# Patient Record
Sex: Male | Born: 1995 | Race: White | Hispanic: No | Marital: Single | Smoking: Current every day smoker
Health system: Southern US, Community
[De-identification: ages and names within clinical notes are randomized; demographics above are authoritative.]

## PROBLEM LIST (undated history)

## (undated) DIAGNOSIS — M24419 Recurrent dislocation, unspecified shoulder: Secondary | ICD-10-CM

## (undated) HISTORY — PX: ANTERIOR CRUCIATE LIGAMENT REPAIR: SHX115

---
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
## 2015-07-28 ENCOUNTER — Emergency Department (HOSPITAL_COMMUNITY): Payer: Worker's Compensation

## 2015-07-28 ENCOUNTER — Emergency Department (HOSPITAL_COMMUNITY)
Admission: EM | Admit: 2015-07-28 | Discharge: 2015-07-28 | Disposition: A | Discharge status: Home / Self Care | Payer: Worker's Compensation | Attending: Emergency Medicine | Admitting: Emergency Medicine

## 2015-07-28 ENCOUNTER — Encounter (HOSPITAL_COMMUNITY): Payer: Self-pay | Admitting: Emergency Medicine

## 2015-07-28 DIAGNOSIS — M25512 Pain in left shoulder: Secondary | ICD-10-CM

## 2015-07-28 DIAGNOSIS — Z7982 Long term (current) use of aspirin: Secondary | ICD-10-CM | POA: Insufficient documentation

## 2015-07-28 DIAGNOSIS — F17210 Nicotine dependence, cigarettes, uncomplicated: Secondary | ICD-10-CM | POA: Insufficient documentation

## 2015-07-28 HISTORY — DX: Recurrent dislocation, unspecified shoulder: M24.419

## 2015-07-28 MED ORDER — KETOROLAC TROMETHAMINE 60 MG/2ML IM SOLN
60.0000 mg | Freq: Once | INTRAMUSCULAR | Status: AC
  Administered 2015-07-28: 60 mg via INTRAMUSCULAR
  Filled 2015-07-28: qty 2

## 2015-07-28 NOTE — ED Notes (Signed)
Pt from work with c/o shoulder dislocation while shutting a door.  Hx of dislocation.  Shoulder already reset.  NAD, A&O.

## 2015-07-28 NOTE — Discharge Instructions (Signed)
You have been seen today for shoulder pain. Your imaging showed no abnormalities. It is possible you dislocated your shoulder, as reported, and that it was put back in place sufficiently, as reported. You will need to follow up with Dr. Thomasena Edis as soon as possible on this matter. Shoulder dislocations will continue to happen, possibly even at inopportune moments, until the problem is surgically repaired. Take extra care to avoid the movements that you know will dislocate your shoulder. Use ibuprofen or naproxen for pain and inflammation. Apply ice as needed to reduce inflammation. Follow up with PCP as needed. Return to ED should symptoms worsen.  RESOURCE GUIDE  Chronic Pain Problems: Contact [facility]  [phone] Patients need to be referred by their primary care doctor.  Insufficient Money for Medicine: Contact United Way:  call "211" or [facility] [phone].  No Primary Care Doctor: - Call [facility] Connect  [phone] - can help you locate a primary care doctor that  accepts your insurance, provides certain services, etc. - Physician Referral Service- [phone]  Agencies that provide inexpensive medical care: - [facility]  [phone] - [facility]  [phone] - [facility]  [phone] - Women's Clinic  [phone] - [facility]  [phone] [facility]  [phone]  Medicaid-accepting [facility] Providers: - [facility]- [address]  [phone], Mon-Fri 9am-7pm, Sat 9am-1pm - [facility]- [address] [place], Suite [place]  [phone] - [facility]- [address], [address] [place]  [phone] [facility]- [address]  [phone] - Renaye Rakers- [address] [place], [address], [phone]  Only accepts [place] patients after they have their name  applied to their card  Self Pay (no  insurance) in [place]: - Sickle Cell Patients: Dr Willey Blade, [facility]  [address] [place], [phone] - [facility]- [address] [place]  [phone]       [facility] [place]- [address]       -     [facility]- see information above (Speak to [employer] if you do not have insurance)       -  [facility]- [address] [place], [phone]       -  [facility]- [address],  [phone]       -  [facility]- [address] [phone]       -  Dr Julio Sicks-  [address], [place], [phone]       -  [facility]- [address], [phone]       -  [facility]- [address], [phone], also [address] [phone]       -    [facility]- [address] [place], [phone], 1st & 3rd Saturday   every month, 10am-1pm  1) Find a Doctor and Pay Out of Pocket Although you won't have to find out who is covered by your insurance plan, it is a good idea to ask around and get recommendations. You will then need to call the office and see if the doctor you have chosen will accept you as a new patient and what types of options they offer for patients  who are self-pay. Some doctors offer discounts or will set up payment plans for their patients who do not have insurance, but you will need to ask so you aren't surprised when you get to your appointment.  2) Contact Your Local Health Department Not all health departments have doctors that can see patients for sick visits, but many do, so it is worth a call to see if yours does. If you don't know where your local health department is, you can check in your phone book. The CDC also has a tool to help you locate your state's health department, and many state websites also have listings of all of their local health departments.  3) Find a Walk-in Clinic If your illness is not likely to be very severe  or complicated, you may want to try a walk in clinic. These are popping up all over the country in pharmacies, drugstores, and shopping centers. They're usually staffed by nurse practitioners or physician assistants that have been trained to treat common illnesses and complaints. They're usually fairly quick and inexpensive. However, if you have serious medical issues or chronic medical problems, these are probably not your best option  STD Testing - [facility] Department of [facility], STD Clinic, [address] [place], phone [phone] or [phone].  Monday - Friday, call for an appointment. [facility] Department of [employer], STD Clinic, [address], [place], phone [phone] or [phone].  Monday - Friday, call for an appointment.  Abuse/Neglect: [facility] Child Abuse Hotline (301)814-8542 [facility] Child Abuse Hotline 332-088-4107 (After Hours)  Emergency Shelter:  [facility] 250-338-1368  Maternity Homes: - Room at the [place] [facility] 9794094320 - [facility] [phone]  MRSA Hotline #:   (206)772-6716  [facility] Resources  [facility] [place]  United Way [facility] Dept. [address]                 [address]         [address] [place] [address]  Blondell Reveal Phone:  [phone]                                  Phone:  [phone]                   Phone:  [phone]  [facility], [phone] - [facility] - [phone]       -     [facility] in [place], [address],                                  [phone], Insurance  [place] Child Abuse Hotline 978-700-7810 or (623) 145-8753 (After Hours)   Behavioral Health Services  Substance  Abuse Resources: -  [facility] Drug Services  ([phone] - [facility] [phone] - [facility] [phone] Floyd[facility] Flock[facility] [phone] - Residential & Outpatient Substance Abuse Program  [phone]  Psychological Services: Tressie Ellis- [facility]  [phone] - [facility]  [phone] - [facility], [phone] [place] [address], [place], ACCESS LINE: [phone] or ([phone], [email or web address]  Dental Assistance  If unable to pay or uninsured, contact:  [facility] or [facility] to become qualified for the adult dental clinic.  Patients with Medicaid: [facility] [phone], [phone], [phone]  If unable to pay, or uninsured, contact [facility] [phone]) or [facility] [phone] in [place], [phone] in [facility]) to become qualified for the adult dental clinic   Other Low-Cost Community Dental Services: - [facility]- [address], [place], [place], [address], [phone], Ext. [address], 2nd and 4th Thursday of the month at 6:30am.  10 clients each day by appointment, can sometimes see walk-in patients if someone does not show for an appointment. [facility]- [address], [place], [place], [address], [phone] - [facility]- [address], [place], [place], [address], [phone] - [facility]- [phone] [facility]- [facility]- [phone] [facility]- [phone]

## 2015-07-28 NOTE — ED Provider Notes (Signed)
CSN: [id number]     Arrival date & time 07/28/15  1413 History   First MD Initiated Contact with Patient 07/28/15 1415     Chief Complaint  Patient presents with  . Shoulder Injury     (Consider location/radiation/quality/duration/timing/severity/associated sxs/prior Treatment) HPI   Cameron Gonzales is a 20 y.o. male, with a history of left shoulder dislocation, presenting to the ED with left shoulder pain that occurred just PTA. Rates pain 0.5/10, aching, nonradiating. 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 This has happened at least 4 times before and is due to a labral tear at the patient has been told he needs surgery to correct. Patient has previously seen Dr. [facility] with [facility]. Patient states that he corrected the dislocation himself. Patient denies neuro or functional deficits, falls/trauma, or any other complaints or injuries.    Past Medical History  Diagnosis Date  . Shoulder dislocation, recurrent     left   Past Surgical History  Procedure Laterality Date  . Anterior cruciate ligament repair     History reviewed. No pertinent family history. Social History  Substance Use Topics  . Smoking status: Current Every Day Smoker -- 0.50 packs/day    Types: Cigarettes  . Smokeless tobacco: Never Used  . Alcohol Use: No    Review of Systems  Musculoskeletal: Positive for arthralgias (left shoulder). Negative for back pain.  Neurological: Negative for weakness and numbness.      Allergies  Review of patient's allergies indicates no known allergies.  Home Medications   Prior to Admission medications   Medication Sig Start Date End Date Taking? Authorizing Provider  aspirin 81 MG chewable tablet Chew 81 mg by mouth daily.    Historical Provider, MD  ibuprofen (ADVIL,MOTRIN) 200 MG tablet Take 600 mg by mouth every 6 (six) hours as needed. For pain & fever    Historical Provider, MD   BP 150/87  mmHg  Pulse 74  Temp(Src) 98.5 F (36.9 C) (Oral)  Resp 18  SpO2 100% Physical Exam  Constitutional: He appears well-developed and well-nourished. No distress.  HENT:  Head: Normocephalic and atraumatic.  Eyes: Conjunctivae are normal.  Cardiovascular: Normal rate and regular rhythm.   Pulmonary/Chest: Effort normal.  Musculoskeletal:  Full passive and active range of motion in the left shoulder and left elbow with minimal pain. No indication on exam to suggest recurrent dislocation. Circulation intact distally.  Neurological: He is alert.  Sensory deficits. Strength 5 out of 5.  Skin: Skin is warm and dry. He is not diaphoretic.  Nursing note and vitals reviewed.   ED Course  Procedures (including critical care time)  Imaging Review Dg Shoulder Left  07/28/2015  CLINICAL DATA:  20 year old who sustained a glenohumeral dislocation earlier today which he had reduced himself. Initial encounter. EXAM: LEFT SHOULDER - 2+ VIEW COMPARISON:  None. FINDINGS: Glenohumeral joint anatomically aligned. No evidence of Hill-Sachs deformity or [place] lesion. Subacromial space well preserved. Acromioclavicular joint intact. No intrinsic osseous abnormality. IMPRESSION: Anatomic alignment of the glenohumeral joint post reduction. No osseous abnormality. Electronically Signed   By: Hulan Saas M.D.   On: 07/28/2015 15:20   I have personally reviewed and evaluated these images as part of my medical decision-making.   EKG Interpretation None      MDM   Final diagnoses:  Shoulder pain, left    Cameron Gonzales presents with left shoulder pain after a reported shoulder dislocation  that occurred just prior to arrival.  This patient's story is consistent with a person who has had multiple shoulder dislocations in the past. X-ray shows proper placement. Recommend the patient follows up with [facility] as soon as possible for reassessment and possible corrective surgery.  Patient to return to duty with careful precautions to avoid further dislocations. Patient was advised there is no way to guarantee that this issue would not recur while on duty. Home care and return precautions discussed. Patient voiced understanding of these instructions and is comfortable with discharge.  Filed Vitals:   07/28/15 1432  BP: 150/87  Pulse: 74  Temp: 98.5 F (36.9 C)  TempSrc: Oral  Resp: 18  SpO2: 100%     Anselm PancoastShawn C Donnel Venuto, PA-C 07/28/15 1543  Linwood DibblesJon Knapp, MD 07/28/15 [phone]

## 2016-03-15 ENCOUNTER — Emergency Department (HOSPITAL_COMMUNITY)
Admission: EM | Admit: 2016-03-15 | Discharge: 2016-03-15 | Disposition: A | Discharge status: Home / Self Care | Payer: BLUE CROSS/BLUE SHIELD | Attending: Emergency Medicine | Admitting: Emergency Medicine

## 2016-03-15 ENCOUNTER — Emergency Department (HOSPITAL_COMMUNITY): Payer: BLUE CROSS/BLUE SHIELD

## 2016-03-15 ENCOUNTER — Encounter (HOSPITAL_COMMUNITY): Payer: Self-pay

## 2016-03-15 DIAGNOSIS — Z7982 Long term (current) use of aspirin: Secondary | ICD-10-CM | POA: Insufficient documentation

## 2016-03-15 DIAGNOSIS — Y929 Unspecified place or not applicable: Secondary | ICD-10-CM | POA: Diagnosis not present

## 2016-03-15 DIAGNOSIS — F17210 Nicotine dependence, cigarettes, uncomplicated: Secondary | ICD-10-CM | POA: Diagnosis not present

## 2016-03-15 DIAGNOSIS — S4992XA Unspecified injury of left shoulder and upper arm, initial encounter: Secondary | ICD-10-CM | POA: Diagnosis present

## 2016-03-15 DIAGNOSIS — Y9367 Activity, basketball: Secondary | ICD-10-CM | POA: Diagnosis not present

## 2016-03-15 DIAGNOSIS — S43005A Unspecified dislocation of left shoulder joint, initial encounter: Secondary | ICD-10-CM | POA: Insufficient documentation

## 2016-03-15 DIAGNOSIS — X58XXXA Exposure to other specified factors, initial encounter: Secondary | ICD-10-CM | POA: Diagnosis not present

## 2016-03-15 DIAGNOSIS — Y999 Unspecified external cause status: Secondary | ICD-10-CM | POA: Insufficient documentation

## 2016-03-15 MED ORDER — KETOROLAC TROMETHAMINE 30 MG/ML IJ SOLN
30.0000 mg | Freq: Once | INTRAMUSCULAR | Status: AC
  Administered 2016-03-15: 30 mg via INTRAVENOUS
  Filled 2016-03-15: qty 1

## 2016-03-15 NOTE — ED Provider Notes (Addendum)
[facility]-EMERGENCY DEPT Provider Note   CSN: [id number] Arrival date & time: 03/15/16  2014     History   Chief Complaint Chief Complaint  Patient presents with  . Shoulder Pain    LEFT    HPI Cameron Gonzales is a 20 y.o. male.  The history is provided by the patient. No language interpreter was used.  Shoulder Pain      Cameron Gonzales is a 20 y.o. male who presents to the Emergency Department complaining of shoulder injury.  He is right-handed and has a history of left shoulder dislocation 4. Today he was playing basketball and his shoulder dislocated. He was able to get it back in place on his own and presents for treatment. He denies any numbness. He has no medical problems and denies any additional injuries.  Past Medical History:  Diagnosis Date  . Shoulder dislocation, recurrent    left    There are no active problems to display for this patient.   Past Surgical History:  Procedure Laterality Date  . ANTERIOR CRUCIATE LIGAMENT REPAIR         Home Medications    Prior to Admission medications   Medication Sig Start Date End Date Taking? Authorizing Provider  aspirin 81 MG chewable tablet Chew 81 mg by mouth daily.    Historical Provider, MD  ibuprofen (ADVIL,MOTRIN) 200 MG tablet Take 600 mg by mouth every 6 (six) hours as needed. For pain & fever    Historical Provider, MD    Family History History reviewed. No pertinent family history.  Social History Social History  Substance Use Topics  . Smoking status: Current Every Day Smoker    Packs/day: 0.50    Types: Cigarettes  . Smokeless tobacco: Never Used  . Alcohol use No     Allergies   Patient has no known allergies.   Review of Systems Review of Systems  All other systems reviewed and are negative.    Physical Exam Updated Vital Signs Ht 5\' 10"  (1.778 m)   Wt 200 lb (90.7 kg)   BMI 28.70 kg/m   Physical Exam  Constitutional: He is oriented to person, place, and time. He appears  well-developed and well-nourished.  Uncomfortable appearing  HENT:  Head: Normocephalic and atraumatic.  Cardiovascular: Normal rate and regular rhythm.   No murmur heard. Pulmonary/Chest: Effort normal and breath sounds normal. No respiratory distress.  Abdominal: Soft. There is no tenderness. There is no rebound and no guarding.  Musculoskeletal:  2+ radial pulses bilaterally. Left shoulder deformity with local tenderness to palpation. Wiggles all digits.  Neurological: He is alert and oriented to person, place, and time.  5 out of 5 grip strength bilaterally.  Skin: Skin is warm and dry.  Psychiatric: He has a normal mood and affect. His behavior is normal.  Nursing note and vitals reviewed.    ED Treatments / Results  Labs (all labs ordered are listed, but only abnormal results are displayed) Labs Reviewed - No data to display  EKG  EKG Interpretation None       Radiology No results found.  Procedures Procedures (including critical care time) Reduction of dislocation Date/Time: 9:36 PM Performed by: Tilden FossaElizabeth Mynor Witkop Authorized by: Tilden FossaElizabeth Kitti Mcclish Consent: Verbal consent obtained. Risks and benefits: risks, benefits and alternatives were discussed Consent given by: patient Required items:  special equipment available Time out: Immediately prior to procedure a "time out" was called to verify the correct patient, procedure, equipment, support staff  Joint: Left shoulder, anterior  dislocation Reduction technique: External rotation    Medications Ordered in ED Medications  ketorolac (TORADOL) 30 MG/ML injection 30 mg (not administered)     Initial Impression / Assessment and Plan / ED Course  I have reviewed the triage vital signs and the nursing notes.  Pertinent labs & imaging results that were available during my care of the patient were reviewed by me and considered in my medical decision making (see chart for details).  Clinical Course     Patient with  history of shoulder dislocation here with recurrent dislocation. He was neurovascularly intact on initial evaluation and joint was reduced on ED arrival without medications per patient's preference. Postprocedure he was neurovascularly intact and could cross midline and reach his right shoulder. He was placed in the sling with plan for orthopedics follow-up.   Final Clinical Impressions(s) / ED Diagnoses   Final diagnoses:  Dislocation of left shoulder joint, initial encounter    New Prescriptions New Prescriptions   No medications on file     Tilden FossaElizabeth Dolorez Jeffrey, MD 03/15/16 2137    Tilden FossaElizabeth Letecia Arps, MD 03/24/16 [phone]

## 2016-03-15 NOTE — ED Triage Notes (Signed)
PT C/O LEFT SHOULDER PAIN DUE TO A POSSIBLE DISLOCATION WHILE PLAYING BASKETBALL TONIGHT. PT STS THIS HAS HAPPENED BEFORE ON THAT SIDE. DENIES INJURY.

## 2016-03-19 ENCOUNTER — Other Ambulatory Visit: Payer: Self-pay | Admitting: Ophthalmology

## 2016-03-19 DIAGNOSIS — M24412 Recurrent dislocation, left shoulder: Secondary | ICD-10-CM

## 2016-04-03 ENCOUNTER — Ambulatory Visit
Admission: RE | Admit: 2016-04-03 | Discharge: 2016-04-03 | Disposition: A | Discharge status: Home / Self Care | Payer: BLUE CROSS/BLUE SHIELD | Source: Ambulatory Visit | Attending: Ophthalmology | Admitting: Ophthalmology

## 2016-04-03 DIAGNOSIS — M24412 Recurrent dislocation, left shoulder: Secondary | ICD-10-CM

## 2018-05-09 IMAGING — MR MR SHOULDER*L* W/O CM
5 series · 40 of 40 positions shown · non-contrast
Comparison: None.

CLINICAL DATA: Left shoulder pain, multiple dislocations

EXAM:
MRI OF THE LEFT SHOULDER WITHOUT CONTRAST
TECHNIQUE: Multiplanar, multisequence MR imaging of the shoulder was performed.
No intravenous contrast was administered.

[Series 6: T2 fat-sat · axial · left · 3.0mm · 0.55mm/px · z∈[-69,+30]mm · 9 of 28 slices shown (1 of 3)]
[im 1/28]
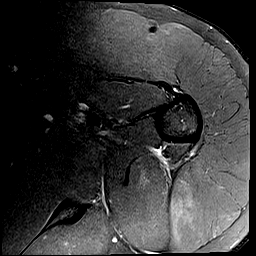
[im 4/28]
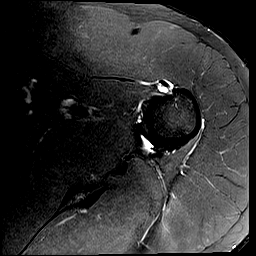
[im 7/28]
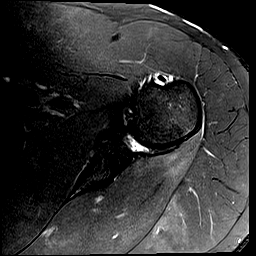
[im 11/28]
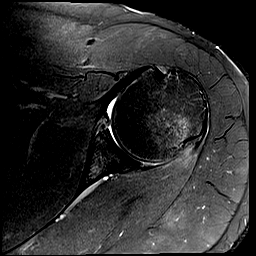
[im 14/28]
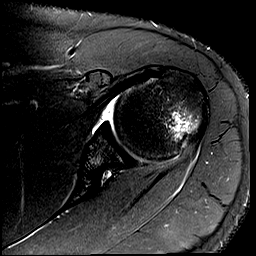
[im 17/28]
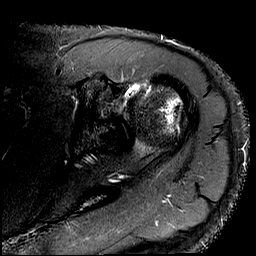
[im 21/28]
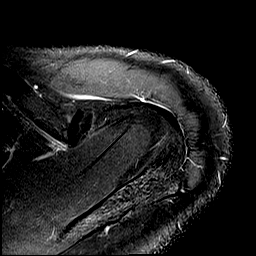
[im 24/28]
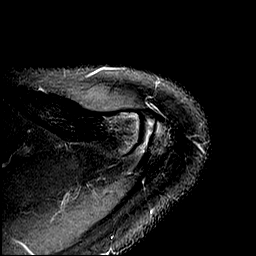
[im 28/28]
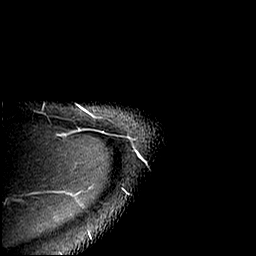

[Series 7: T2 fat-sat · oblique · left · 3.0mm · 0.55mm/px · 7 of 21 slices shown (2 of 3)]
[im 1/21]
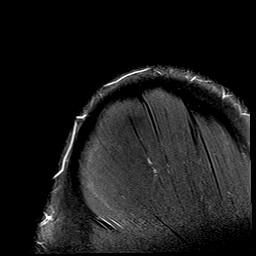
[im 4/21]
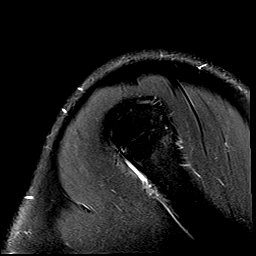
[im 7/21]
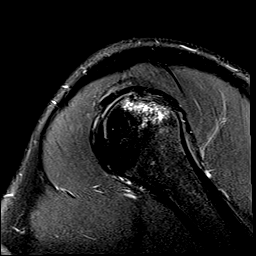
[im 11/21]
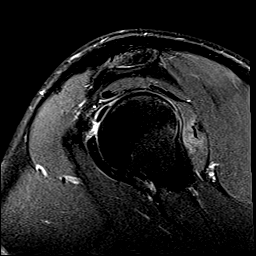
[im 14/21]
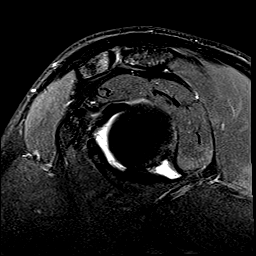
[im 17/21]
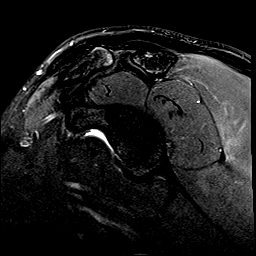
[im 21/21]
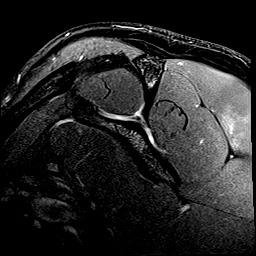

[Series 8: T1 · oblique · left · 3.0mm · 0.44mm/px · 8 of 21 slices shown]
[im 1/21]
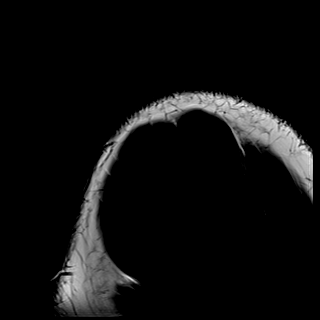
[im 3/21]
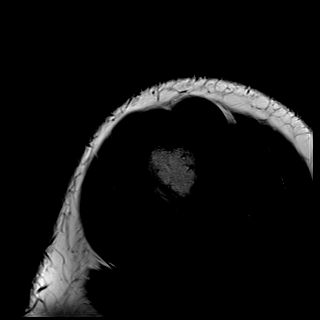
[im 6/21]
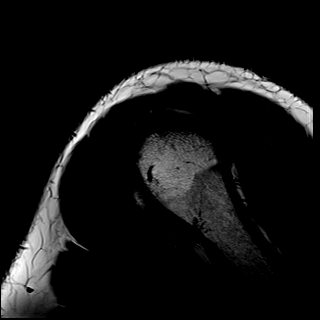
[im 9/21]
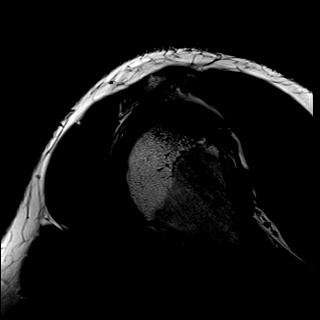
[im 12/21]
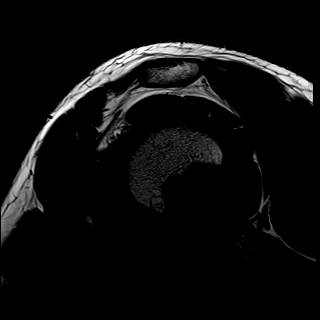
[im 15/21]
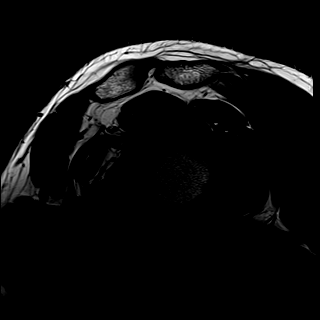
[im 18/21]
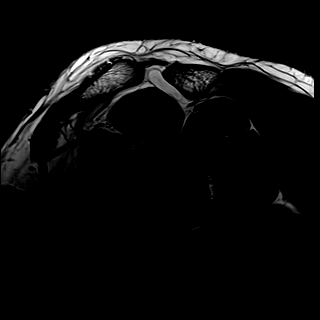
[im 21/21]
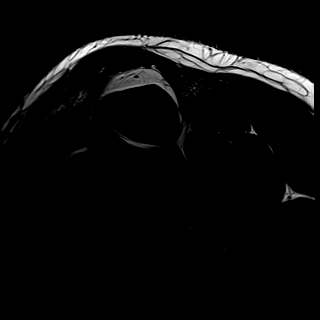

[Series 9: PD · oblique · left · 3.0mm · 0.44mm/px · 8 of 21 slices shown]
[im 1/21]
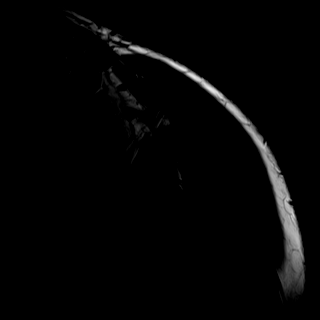
[im 3/21]
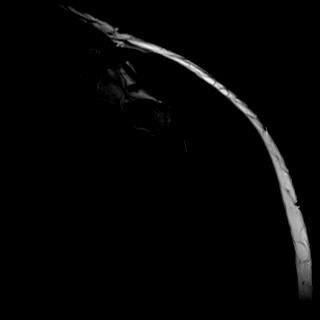
[im 6/21]
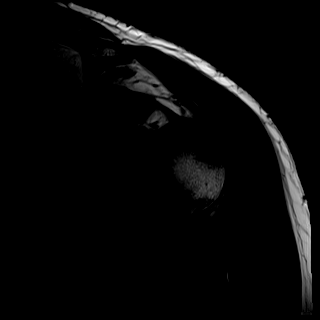
[im 9/21]
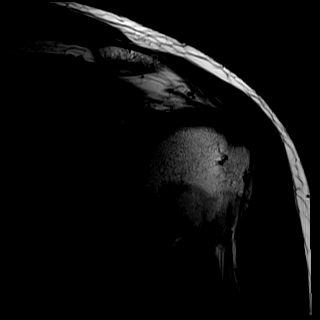
[im 12/21]
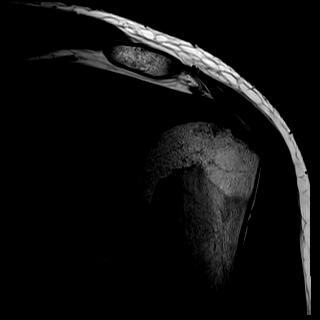
[im 15/21]
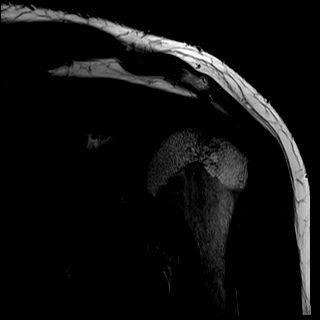
[im 18/21]
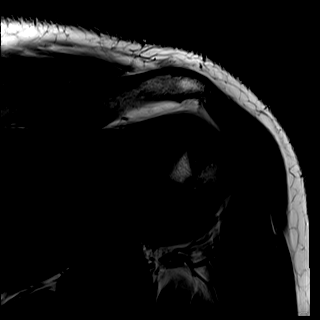
[im 21/21]
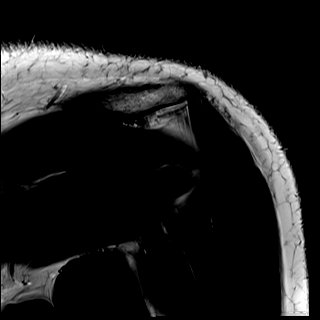

[Series 10: T2 fat-sat · oblique · left · 3.0mm · 0.55mm/px · 8 of 21 slices shown (3 of 3)]
[im 1/21]
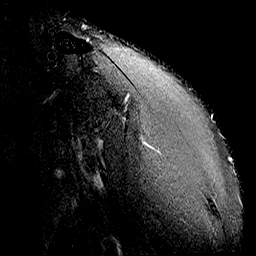
[im 3/21]
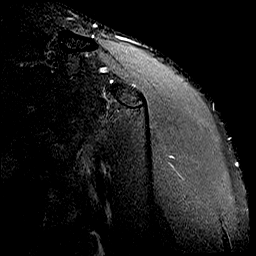
[im 6/21]
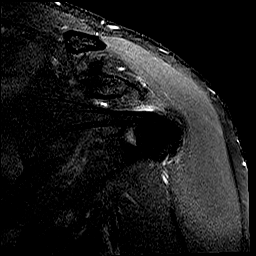
[im 9/21]
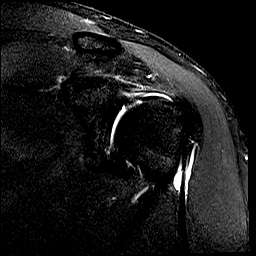
[im 12/21]
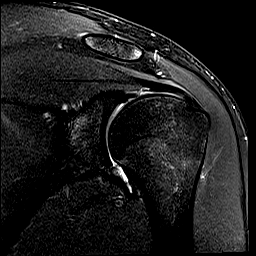
[im 15/21]
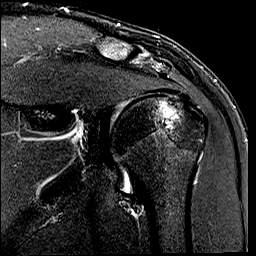
[im 18/21]
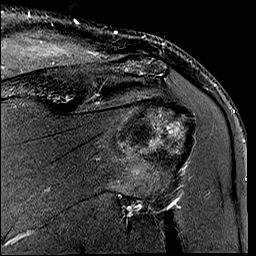
[im 21/21]
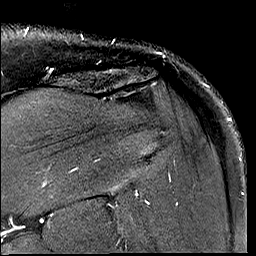

[40 of 40 positions shown; findings below may reference images not displayed]

FINDINGS: Rotator cuff: Supraspinatus tendon is intact. Mild tendinosis of the
infraspinatus tendon. Teres minor tendon is intact. Subscapularis
tendon is intact.

Muscles: No muscle atrophy. Mild muscle edema in the teres minor and
deltoid muscle.

Biceps long head:  Intact.

Acromioclavicular Joint: Normal acromioclavicular joint. Type I
acromion. No subacromial/subdeltoid bursal fluid.

Glenohumeral Joint: No joint effusion.  No chondral defect.

Labrum: Limited evaluation secondary to lack of intra-articular
contrast. Anterior inferior labral tear.

Bones: Marrow edema in the posterior superolateral humeral head with
associated concavity consistent with a Hill-Sachs lesion.

Other: No fluid collection or hematoma.
IMPRESSION: 1. Anterior inferior labral tear. Acute Hill-Sachs lesion of the
posterior superolateral humeral head. Findings are most consistent
with sequela of anterior shoulder dislocation.
2. Mild tendinosis of the infraspinatus tendon.
